# Patient Record
Sex: Male | Born: 1975 | Race: Black or African American | Hispanic: No | Marital: Single | Smoking: Current every day smoker
Health system: Southern US, Community
[De-identification: ages and names within clinical notes are randomized; demographics above are authoritative.]

## PROBLEM LIST (undated history)

## (undated) DIAGNOSIS — K219 Gastro-esophageal reflux disease without esophagitis: Secondary | ICD-10-CM

## (undated) DIAGNOSIS — I10 Essential (primary) hypertension: Secondary | ICD-10-CM

---

## 2000-08-07 ENCOUNTER — Encounter: Payer: Self-pay | Admitting: Emergency Medicine

## 2000-08-07 ENCOUNTER — Emergency Department (HOSPITAL_COMMUNITY): Admission: EM | Admit: 2000-08-07 | Discharge: 2000-08-07 | Payer: Self-pay | Admitting: Internal Medicine

## 2000-08-07 ENCOUNTER — Encounter: Payer: Self-pay | Admitting: Internal Medicine

## 2001-12-25 ENCOUNTER — Encounter: Payer: Self-pay | Admitting: Internal Medicine

## 2001-12-25 ENCOUNTER — Ambulatory Visit (HOSPITAL_COMMUNITY): Admission: RE | Admit: 2001-12-25 | Discharge: 2001-12-25 | Payer: Self-pay | Admitting: Internal Medicine

## 2004-03-11 ENCOUNTER — Emergency Department (HOSPITAL_COMMUNITY): Admission: EM | Admit: 2004-03-11 | Discharge: 2004-03-11 | Payer: Self-pay | Admitting: Emergency Medicine

## 2010-09-17 ENCOUNTER — Encounter: Payer: Self-pay | Admitting: *Deleted

## 2010-09-17 ENCOUNTER — Emergency Department (HOSPITAL_COMMUNITY): Payer: No Typology Code available for payment source

## 2010-09-17 ENCOUNTER — Emergency Department (HOSPITAL_COMMUNITY)
Admission: EM | Admit: 2010-09-17 | Discharge: 2010-09-17 | Disposition: A | Discharge status: Home / Self Care | Payer: No Typology Code available for payment source | Attending: Emergency Medicine | Admitting: Emergency Medicine

## 2010-09-17 DIAGNOSIS — T148XXA Other injury of unspecified body region, initial encounter: Secondary | ICD-10-CM | POA: Insufficient documentation

## 2010-09-17 DIAGNOSIS — F17200 Nicotine dependence, unspecified, uncomplicated: Secondary | ICD-10-CM | POA: Insufficient documentation

## 2010-09-17 DIAGNOSIS — M549 Dorsalgia, unspecified: Secondary | ICD-10-CM | POA: Insufficient documentation

## 2010-09-17 DIAGNOSIS — R109 Unspecified abdominal pain: Secondary | ICD-10-CM | POA: Insufficient documentation

## 2010-09-17 HISTORY — DX: Gastro-esophageal reflux disease without esophagitis: K21.9

## 2010-09-17 MED ORDER — HYDROCODONE-ACETAMINOPHEN 5-325 MG PO TABS: ORAL_TABLET | ORAL | Status: DC

## 2010-09-17 MED ORDER — METHOCARBAMOL 500 MG PO TABS: ORAL_TABLET | ORAL | Status: DC

## 2010-09-17 MED ORDER — IOHEXOL 300 MG/ML  SOLN
100.0000 mL | Freq: Once | INTRAMUSCULAR | Status: AC | PRN
  Administered 2010-09-17: 100 mL via INTRAVENOUS
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
## 2010-09-18 NOTE — ED Provider Notes (Signed)
Medical screening examination/treatment/procedure(s) were performed by non-physician practitioner and as supervising physician I was immediately available for consultation/collaboration.   Geoffery Lyons, MD 09/18/10 [phone]

## 2014-05-09 ENCOUNTER — Emergency Department (HOSPITAL_COMMUNITY)
Admission: EM | Admit: 2014-05-09 | Discharge: 2014-05-09 | Disposition: A | Discharge status: Home / Self Care | Payer: Self-pay | Attending: Emergency Medicine | Admitting: Emergency Medicine

## 2014-05-09 ENCOUNTER — Encounter (HOSPITAL_COMMUNITY): Payer: Self-pay | Admitting: *Deleted

## 2014-05-09 ENCOUNTER — Emergency Department (HOSPITAL_COMMUNITY): Payer: Self-pay

## 2014-05-09 DIAGNOSIS — Y998 Other external cause status: Secondary | ICD-10-CM | POA: Insufficient documentation

## 2014-05-09 DIAGNOSIS — S40812A Abrasion of left upper arm, initial encounter: Secondary | ICD-10-CM | POA: Insufficient documentation

## 2014-05-09 DIAGNOSIS — Z720 Tobacco use: Secondary | ICD-10-CM | POA: Insufficient documentation

## 2014-05-09 DIAGNOSIS — Y9389 Activity, other specified: Secondary | ICD-10-CM | POA: Insufficient documentation

## 2014-05-09 DIAGNOSIS — S161XXA Strain of muscle, fascia and tendon at neck level, initial encounter: Secondary | ICD-10-CM | POA: Insufficient documentation

## 2014-05-09 DIAGNOSIS — S50311A Abrasion of right elbow, initial encounter: Secondary | ICD-10-CM | POA: Insufficient documentation

## 2014-05-09 DIAGNOSIS — Z8719 Personal history of other diseases of the digestive system: Secondary | ICD-10-CM | POA: Insufficient documentation

## 2014-05-09 DIAGNOSIS — Y92410 Unspecified street and highway as the place of occurrence of the external cause: Secondary | ICD-10-CM | POA: Insufficient documentation

## 2014-05-09 DIAGNOSIS — S40212A Abrasion of left shoulder, initial encounter: Secondary | ICD-10-CM | POA: Insufficient documentation

## 2014-05-09 LAB — COMPREHENSIVE METABOLIC PANEL
ALK PHOS: 39 U/L (ref 39–117)
ALT: 39 U/L (ref 0–53)
ANION GAP: 8 (ref 5–15)
AST: 33 U/L (ref 0–37)
Albumin: 4.3 g/dL (ref 3.5–5.2)
BILIRUBIN TOTAL: 0.6 mg/dL (ref 0.3–1.2)
BUN: 9 mg/dL (ref 6–23)
CHLORIDE: 102 mmol/L (ref 96–112)
CO2: 27 mmol/L (ref 19–32)
Calcium: 9.4 mg/dL (ref 8.4–10.5)
Creatinine, Ser: 1.1 mg/dL (ref 0.50–1.35)
GFR calc non Af Amer: 82 mL/min — ABNORMAL LOW (ref >90)
GLUCOSE: 86 mg/dL (ref 70–99)
POTASSIUM: 4.2 mmol/L (ref 3.5–5.1)
Sodium: 137 mmol/L (ref 135–145)
Total Protein: 7.2 g/dL (ref 6.0–8.3)

## 2014-05-09 LAB — CBC WITH DIFFERENTIAL/PLATELET
BASOS PCT: 0 % (ref 0–1)
Basophils Absolute: 0 10*3/uL (ref 0.0–0.1)
Eosinophils Absolute: 0.1 10*3/uL (ref 0.0–0.7)
Eosinophils Relative: 1 % (ref 0–5)
HEMATOCRIT: 49.9 % (ref 39.0–52.0)
HEMOGLOBIN: 17 g/dL (ref 13.0–17.0)
LYMPHS ABS: 1.7 10*3/uL (ref 0.7–4.0)
LYMPHS PCT: 25 % (ref 12–46)
MCH: 28.1 pg (ref 26.0–34.0)
MCHC: 34.1 g/dL (ref 30.0–36.0)
MCV: 82.5 fL (ref 78.0–100.0)
MONO ABS: 0.6 10*3/uL (ref 0.1–1.0)
MONOS PCT: 9 % (ref 3–12)
NEUTROS ABS: 4.4 10*3/uL (ref 1.7–7.7)
Neutrophils Relative %: 65 % (ref 43–77)
Platelets: 211 10*3/uL (ref 150–400)
RBC: 6.05 MIL/uL — ABNORMAL HIGH (ref 4.22–5.81)
RDW: 14.7 % (ref 11.5–15.5)
WBC: 6.7 10*3/uL (ref 4.0–10.5)

## 2014-05-09 MED ORDER — TETANUS-DIPHTH-ACELL PERTUSSIS 5-2.5-18.5 LF-MCG/0.5 IM SUSP
0.5000 mL | Freq: Once | INTRAMUSCULAR | Status: AC
  Administered 2014-05-09: 0.5 mL via INTRAMUSCULAR
  Filled 2014-05-09: qty 0.5

## 2014-05-09 MED ORDER — HYDROCODONE-ACETAMINOPHEN 5-325 MG PO TABS: 1.0000 | ORAL_TABLET | ORAL | Status: DC | PRN
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
## 2014-05-09 NOTE — Discharge Instructions (Signed)
Abrasion °An abrasion is a cut or scrape of the skin. Abrasions do not extend through all layers of the skin and most heal within 10 days. It is important to care for your abrasion properly to prevent infection. °CAUSES  °Most abrasions are caused by falling on, or gliding across, the ground or other surface. When your skin rubs on something, the outer and inner layer of skin rubs off, causing an abrasion. °DIAGNOSIS  °Your caregiver will be able to diagnose an abrasion during a physical exam.  °TREATMENT  °Your treatment depends on how large and deep the abrasion is. Generally, your abrasion will be cleaned with water and a mild soap to remove any dirt or debris. An antibiotic ointment may be put over the abrasion to prevent an infection. A bandage (dressing) may be wrapped around the abrasion to keep it from getting dirty.  °You may need a tetanus shot if: °· You cannot remember when you had your last tetanus shot. °· You have never had a tetanus shot. °· The injury broke your skin. °If you get a tetanus shot, your arm may swell, get red, and feel warm to the touch. This is common and not a problem. If you need a tetanus shot and you choose not to have one, there is a rare chance of getting tetanus. Sickness from tetanus can be serious.  °HOME CARE INSTRUCTIONS  °· If a dressing was applied, change it at least once a day or as directed by your caregiver. If the bandage sticks, soak it off with warm water.   °· Wash the area with water and a mild soap to remove all the ointment 2 times a day. Rinse off the soap and pat the area dry with a clean towel.   °· Reapply any ointment as directed by your caregiver. This will help prevent infection and keep the bandage from sticking. Use gauze over the wound and under the dressing to help keep the bandage from sticking.   °· Change your dressing right away if it becomes wet or dirty.   °· Only take over-the-counter or prescription medicines for pain, discomfort, or fever as  directed by your caregiver.   °· Follow up with your caregiver within 24-48 hours for a wound check, or as directed. If you were not given a wound-check appointment, look closely at your abrasion for redness, swelling, or pus. These are signs of infection. °SEEK IMMEDIATE MEDICAL CARE IF:  °· You have increasing pain in the wound.   °· You have redness, swelling, or tenderness around the wound.   °· You have pus coming from the wound.   °· You have a fever or persistent symptoms for more than 2-3 days. °· You have a fever and your symptoms suddenly get worse. °· You have a bad smell coming from the wound or dressing.   °MAKE SURE YOU:  °· Understand these instructions. °· Will watch your condition. °· Will get help right away if you are not doing well or get worse. °Document Released: 11/07/2004 Document Revised: 01/15/2012 Document Reviewed: 01/01/2011 °Exit[employer]® Patient Information ©2015 [employer]. This information is not intended to replace advice given to you by your health care provider. Make sure you discuss any questions you have with your health care provider. ° °Cervical Sprain °A cervical sprain is an injury in the neck in which the strong, fibrous tissues (ligaments) that connect your neck bones stretch or tear. Cervical sprains can range from mild to severe. Severe cervical sprains can cause the neck vertebrae to be unstable.   This can lead to damage of the spinal cord and can result in serious nervous system problems. The amount of time it takes for a cervical sprain to get better depends on the cause and extent of the injury. Most cervical sprains heal in 1 to 3 weeks. °CAUSES  °Severe cervical sprains may be caused by:  °· Contact sport injuries (such as from football, rugby, wrestling, hockey, auto racing, gymnastics, diving, martial arts, or boxing).   °· Motor vehicle collisions.   °· Whiplash injuries. This is an injury from a sudden forward and backward whipping movement of the head and  neck.  °· Falls.   °Mild cervical sprains may be caused by:  °· Being in an awkward position, such as while cradling a telephone between your ear and shoulder.   °· Sitting in a chair that does not offer proper support.   °· Working at a poorly designed computer station.   °· Looking up or down for long periods of time.   °SYMPTOMS  °· Pain, soreness, stiffness, or a burning sensation in the front, back, or sides of the neck. This discomfort may develop immediately after the injury or slowly, 24 hours or more after the injury.   °· Pain or tenderness directly in the middle of the back of the neck.   °· Shoulder or upper back pain.   °· Limited ability to move the neck.   °· Headache.   °· Dizziness.   °· Weakness, numbness, or tingling in the hands or arms.   °· Muscle spasms.   °· Difficulty swallowing or chewing.   °· Tenderness and swelling of the neck.   °DIAGNOSIS  °Most of the time your health care provider can diagnose a cervical sprain by taking your history and doing a physical exam. Your health care provider will ask about previous neck injuries and any known neck problems, such as arthritis in the neck. X-rays may be taken to find out if there are any other problems, such as with the bones of the neck. Other tests, such as a CT scan or MRI, may also be needed.  °TREATMENT  °Treatment depends on the severity of the cervical sprain. Mild sprains can be treated with rest, keeping the neck in place (immobilization), and pain medicines. Severe cervical sprains are immediately immobilized. Further treatment is done to help with pain, muscle spasms, and other symptoms and may include: °· Medicines, such as pain relievers, numbing medicines, or muscle relaxants.   °· Physical therapy. This may involve stretching exercises, strengthening exercises, and posture training. Exercises and improved posture can help stabilize the neck, strengthen muscles, and help stop symptoms from returning.   °HOME CARE INSTRUCTIONS   °· Put ice on the injured area.   °¨ Put ice in a plastic bag.   °¨ Place a towel between your skin and the bag.   °¨ Leave the ice on for 15-20 minutes, 3-4 times a day.   °· If your injury was severe, you may have been given a cervical collar to wear. A cervical collar is a two-piece collar designed to keep your neck from moving while it heals. °¨ Do not remove the collar unless instructed by your health care provider. °¨ If you have long hair, keep it outside of the collar. °¨ Ask your health care provider before making any adjustments to your collar. Minor adjustments may be required over time to improve comfort and reduce pressure on your chin or on the back of your head. °¨ If you are allowed to remove the collar for cleaning or bathing, follow your health care provider's instructions on how to do   so safely. °¨ Keep your collar clean by wiping it with mild soap and water and drying it completely. If the collar you have been given includes removable pads, remove them every 1-2 days and hand wash them with soap and water. Allow them to air dry. They should be completely dry before you wear them in the collar. °¨ If you are allowed to remove the collar for cleaning and bathing, wash and dry the skin of your neck. Check your skin for irritation or sores. If you see any, tell your health care provider. °¨ Do not drive while wearing the collar.   °· Only take over-the-counter or prescription medicines for pain, discomfort, or fever as directed by your health care provider.   °· Keep all follow-up appointments as directed by your health care provider.   °· Keep all physical therapy appointments as directed by your health care provider.   °· Make any needed adjustments to your workstation to promote good posture.   °· Avoid positions and activities that make your symptoms worse.   °· Warm up and stretch before being active to help prevent problems.   °SEEK MEDICAL CARE IF:  °· Your pain is not controlled with  medicine.   °· You are unable to decrease your pain medicine over time as planned.   °· Your activity level is not improving as expected.   °SEEK IMMEDIATE MEDICAL CARE IF:  °· You develop any bleeding. °· You develop stomach upset. °· You have signs of an allergic reaction to your medicine.   °· Your symptoms get worse.   °· You develop new, unexplained symptoms.   °· You have numbness, tingling, weakness, or paralysis in any part of your body.   °MAKE SURE YOU:  °· Understand these instructions. °· Will watch your condition. °· Will get help right away if you are not doing well or get worse. °Document Released: 11/25/2006 Document Revised: 02/02/2013 Document Reviewed: 08/05/2012 °ExitCare® Patient Information ©2015 [employer]. This information is not intended to replace advice given to you by your health care provider. Make sure you discuss any questions you have with your health care provider. ° °Motor Vehicle Collision °It is common to have multiple bruises and sore muscles after a motor vehicle collision (MVC). These tend to feel worse for the first 24 hours. You may have the most stiffness and soreness over the first several hours. You may also feel worse when you wake up the first morning after your collision. After this point, you will usually begin to improve with each day. The speed of improvement often depends on the severity of the collision, the number of injuries, and the location and nature of these injuries. °HOME CARE INSTRUCTIONS °· Put ice on the injured area. °¨ Put ice in a plastic bag. °¨ Place a towel between your skin and the bag. °¨ Leave the ice on for 15-20 minutes, 3-4 times a day, or as directed by your health care provider. °· Drink enough fluids to keep your urine clear or pale yellow. Do not drink alcohol. °· Take a warm shower or bath once or twice a day. This will increase blood flow to sore muscles. °· You may return to activities as directed by your caregiver. Be careful when  lifting, as this may aggravate neck or back pain. °· Only take over-the-counter or prescription medicines for pain, discomfort, or fever as directed by your caregiver. Do not use aspirin. This may increase bruising and bleeding. °SEEK IMMEDIATE MEDICAL CARE IF: °· You have numbness, tingling, or weakness in the arms or   legs. °· You develop severe headaches not relieved with medicine. °· You have severe neck pain, especially tenderness in the middle of the back of your neck. °· You have changes in bowel or bladder control. °· There is increasing pain in any area of the body. °· You have shortness of breath, light-headedness, dizziness, or fainting. °· You have chest pain. °· You feel sick to your stomach (nauseous), throw up (vomit), or sweat. °· You have increasing abdominal discomfort. °· There is blood in your urine, stool, or vomit. °· You have pain in your shoulder (shoulder strap areas). °· You feel your symptoms are getting worse. °MAKE SURE YOU: °· Understand these instructions. °· Will watch your condition. °· Will get help right away if you are not doing well or get worse. °Document Released: 01/28/2005 Document Revised: 06/14/2013 Document Reviewed: 06/27/2010 °ExitCare® Patient Information ©2015 [employer]. This information is not intended to replace advice given to you by your health care provider. Make sure you discuss any questions you have with your health care provider. ° °
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
## 2014-05-09 NOTE — ED Notes (Signed)
MD at bedside. 

## 2014-05-09 NOTE — ED Notes (Signed)
c-collar applied in triage

## 2019-07-14 ENCOUNTER — Emergency Department (HOSPITAL_COMMUNITY)
Admission: EM | Admit: 2019-07-14 | Discharge: 2019-07-14 | Disposition: A | Discharge status: Home / Self Care | Payer: BC Managed Care – PPO | Attending: Emergency Medicine | Admitting: Emergency Medicine

## 2019-07-14 ENCOUNTER — Encounter (HOSPITAL_COMMUNITY): Payer: Self-pay | Admitting: *Deleted

## 2019-07-14 ENCOUNTER — Other Ambulatory Visit: Payer: Self-pay

## 2019-07-14 ENCOUNTER — Emergency Department (HOSPITAL_COMMUNITY): Payer: BC Managed Care – PPO

## 2019-07-14 DIAGNOSIS — I10 Essential (primary) hypertension: Secondary | ICD-10-CM | POA: Insufficient documentation

## 2019-07-14 DIAGNOSIS — R519 Headache, unspecified: Secondary | ICD-10-CM

## 2019-07-14 DIAGNOSIS — F17210 Nicotine dependence, cigarettes, uncomplicated: Secondary | ICD-10-CM | POA: Diagnosis not present

## 2019-07-14 DIAGNOSIS — R42 Dizziness and giddiness: Secondary | ICD-10-CM | POA: Insufficient documentation

## 2019-07-14 LAB — CBC WITH DIFFERENTIAL/PLATELET
Abs Immature Granulocytes: 0.01 10*3/uL (ref 0.00–0.07)
Basophils Absolute: 0 10*3/uL (ref 0.0–0.1)
Basophils Relative: 1 %
Eosinophils Absolute: 0.1 10*3/uL (ref 0.0–0.5)
Eosinophils Relative: 1 %
HCT: 52.2 % — ABNORMAL HIGH (ref 39.0–52.0)
Hemoglobin: 16.9 g/dL (ref 13.0–17.0)
Immature Granulocytes: 0 %
Lymphocytes Relative: 37 %
Lymphs Abs: 1.5 10*3/uL (ref 0.7–4.0)
MCH: 27.5 pg (ref 26.0–34.0)
MCHC: 32.4 g/dL (ref 30.0–36.0)
MCV: 85 fL (ref 80.0–100.0)
Monocytes Absolute: 0.5 10*3/uL (ref 0.1–1.0)
Monocytes Relative: 13 %
Neutro Abs: 2 10*3/uL (ref 1.7–7.7)
Neutrophils Relative %: 48 %
Platelets: 212 10*3/uL (ref 150–400)
RBC: 6.14 MIL/uL — ABNORMAL HIGH (ref 4.22–5.81)
RDW: 14.5 % (ref 11.5–15.5)
WBC: 4.1 10*3/uL (ref 4.0–10.5)
nRBC: 0 % (ref 0.0–0.2)

## 2019-07-14 LAB — COMPREHENSIVE METABOLIC PANEL
ALT: 39 U/L (ref 0–44)
AST: 32 U/L (ref 15–41)
Albumin: 5 g/dL (ref 3.5–5.0)
Alkaline Phosphatase: 33 U/L — ABNORMAL LOW (ref 38–126)
Anion gap: 10 (ref 5–15)
BUN: 13 mg/dL (ref 6–20)
CO2: 25 mmol/L (ref 22–32)
Calcium: 9.1 mg/dL (ref 8.9–10.3)
Chloride: 99 mmol/L (ref 98–111)
Creatinine, Ser: 0.91 mg/dL (ref 0.61–1.24)
GFR calc Af Amer: >60 mL/min (ref >60)
GFR calc non Af Amer: >60 mL/min (ref >60)
Glucose, Bld: 117 mg/dL — ABNORMAL HIGH (ref 70–99)
Potassium: 4.2 mmol/L (ref 3.5–5.1)
Sodium: 134 mmol/L — ABNORMAL LOW (ref 135–145)
Total Bilirubin: 0.8 mg/dL (ref 0.3–1.2)
Total Protein: 7.8 g/dL (ref 6.5–8.1)

## 2019-07-14 MED ORDER — HYDRALAZINE HCL 20 MG/ML IJ SOLN
5.0000 mg | Freq: Once | INTRAMUSCULAR | Status: AC
  Administered 2019-07-14: 5 mg via INTRAVENOUS
  Filled 2019-07-14: qty 1

## 2019-07-14 MED ORDER — MORPHINE SULFATE (PF) 2 MG/ML IV SOLN
2.0000 mg | Freq: Once | INTRAVENOUS | Status: AC
  Administered 2019-07-14: 2 mg via INTRAVENOUS
  Filled 2019-07-14: qty 1

## 2019-07-14 MED ORDER — HYDROCHLOROTHIAZIDE 25 MG PO TABS: 25.0000 mg | ORAL_TABLET | Freq: Every day | ORAL | 0 refills | Status: DC

## 2019-07-14 MED ORDER — HYDROMORPHONE HCL 1 MG/ML IJ SOLN
1.0000 mg | Freq: Once | INTRAMUSCULAR | Status: AC
  Administered 2019-07-14: 1 mg via INTRAVENOUS
  Filled 2019-07-14: qty 1

## 2019-07-14 NOTE — ED Triage Notes (Addendum)
Pt c/o headache behind his eyes x 3 days. Pt c/o "little bit" dizziness and fatigue that started today.   Pt's BP in triage is 175/135. Pt reports his doctor wanted to put him on BP medication years ago, but didn't because of "his age". Pt denies having a PCP at this time.

## 2019-07-14 NOTE — ED Provider Notes (Signed)
[facility] EMERGENCY DEPARTMENT Provider Note   CSN: [id number] Arrival date & time: 07/14/19  1111     History Chief Complaint  Patient presents with  . Headache    Anthony Shea is a 44 y.o. male.  Patient complains of a headache and dizziness.  He has a history of hypertension and has not been taking his medicine  The history is provided by the patient and medical records.  Headache Pain location:  Generalized Quality:  Dull Radiates to:  Does not radiate Severity currently:  7/10 Severity at highest:  8/10 Onset quality:  Sudden Timing:  Constant Progression:  Worsening Chronicity:  New Similar to prior headaches: no   Context: activity   Relieved by:  Nothing Worsened by:  Nothing Associated symptoms: no abdominal pain, no back pain, no congestion, no cough, no diarrhea, no fatigue, no seizures and no sinus pressure        Past Medical History:  Diagnosis Date  . GERD (gastroesophageal reflux disease)     There are no problems to display for this patient.   History reviewed. No pertinent surgical history.     No family history on file.  Social History   Tobacco Use  . Smoking status: Current Every Day Smoker    Packs/day: 0.50    Types: Cigarettes  . Smokeless tobacco: Never Used  Substance Use Topics  . Alcohol use: Yes    Comment: couple shots of liquor every couple of weeks   . Drug use: No    Home Medications Prior to Admission medications   Medication Sig Start Date End Date Taking? Authorizing Provider  ibuprofen (ADVIL) 200 MG tablet Take 200 mg by mouth every 6 (six) hours as needed for mild pain or moderate pain.   Yes [provider]    Allergies    Banana  Review of Systems   Review of Systems  Constitutional: Negative for appetite change and fatigue.  HENT: Negative for congestion, ear discharge and sinus pressure.   Eyes: Negative for discharge.  Respiratory: Negative for cough.   Cardiovascular: Negative for  chest pain.  Gastrointestinal: Negative for abdominal pain and diarrhea.  Genitourinary: Negative for frequency and hematuria.  Musculoskeletal: Negative for back pain.  Skin: Negative for rash.  Neurological: Positive for headaches. Negative for seizures.  Psychiatric/Behavioral: Negative for hallucinations.    Physical Exam Updated Vital Signs BP (!) 160/101 (BP Location: Left Arm)   Pulse 92   Temp 99.2 F (37.3 C) (Oral)   Resp 19   Ht 5\' 4"  (1.626 m)   Wt 74.8 kg   SpO2 97%   BMI 28.32 kg/m   Physical Exam Vitals and nursing note reviewed.  Constitutional:      Appearance: He is well-developed.  HENT:     Head: Normocephalic.     Nose: Nose normal.  Eyes:     General: No scleral icterus.    Conjunctiva/sclera: Conjunctivae normal.  Neck:     Thyroid: No thyromegaly.  Cardiovascular:     Rate and Rhythm: Normal rate and regular rhythm.     Heart sounds: No murmur. No friction rub. No gallop.   Pulmonary:     Breath sounds: No stridor. No wheezing or rales.  Chest:     Chest wall: No tenderness.  Abdominal:     General: There is no distension.     Tenderness: There is no abdominal tenderness. There is no rebound.  Musculoskeletal:  General: Normal range of motion.     Cervical back: Neck supple.  Lymphadenopathy:     Cervical: No cervical adenopathy.  Skin:    Findings: No erythema or rash.  Neurological:     Mental Status: He is alert and oriented to person, place, and time.     Motor: No abnormal muscle tone.     Coordination: Coordination normal.  Psychiatric:        Behavior: Behavior normal.     ED Results / Procedures / Treatments   Labs (all labs ordered are listed, but only abnormal results are displayed) Labs Reviewed  CBC WITH DIFFERENTIAL/PLATELET - Abnormal; Notable for the following components:      Result Value   RBC 6.14 (*)    HCT 52.2 (*)    All other components within normal limits  COMPREHENSIVE METABOLIC PANEL -  Abnormal; Notable for the following components:   Sodium 134 (*)    Glucose, Bld 117 (*)    Alkaline Phosphatase 33 (*)    All other components within normal limits    EKG None  Radiology CT Head Wo Contrast  Result Date: 07/14/2019 CLINICAL DATA:  Retro-orbital headache over the last 3 days. Dizziness. EXAM: CT HEAD WITHOUT CONTRAST TECHNIQUE: Contiguous axial images were obtained from the base of the skull through the vertex without intravenous contrast. COMPARISON:  None FINDINGS: Brain: The brain shows a normal appearance without evidence of malformation, atrophy, old or acute small or large vessel infarction, mass lesion, hemorrhage, hydrocephalus or extra-axial collection. Vascular: No hyperdense vessel. No evidence of atherosclerotic calcification. Skull: Normal.  No traumatic finding.  No focal bone lesion. Sinuses/Orbits: Sinuses are clear. Orbits appear normal. Mastoids are clear. Other: None significant IMPRESSION: Normal head CT Electronically Signed   By: Nelson Chimes M.D.   On: 07/14/2019 14:05    Procedures Procedures (including critical care time)  Medications Ordered in ED Medications  hydrALAZINE (APRESOLINE) injection 5 mg (has no administration in time range)  HYDROmorphone (DILAUDID) injection 1 mg (has no administration in time range)  hydrALAZINE (APRESOLINE) injection 5 mg (5 mg Intravenous Given 07/14/19 1247)  morphine 2 MG/ML injection 2 mg (2 mg Intravenous Given 07/14/19 1417)    ED Course  I have reviewed the triage vital signs and the nursing notes.  Pertinent labs & imaging results that were available during my care of the patient were reviewed by me and considered in my medical decision making (see chart for details). CRITICAL CARE Performed by: Milton Ferguson Total critical care time: 75minutes Critical care time was exclusive of separately billable procedures and treating other patients. Critical care was necessary to treat or prevent imminent or  life-threatening deterioration. Critical care was time spent personally by me on the following activities: development of treatment plan with patient and/or surrogate as well as nursing, discussions with consultants, evaluation of patient's response to treatment, examination of patient, obtaining history from patient or surrogate, ordering and performing treatments and interventions, ordering and review of laboratory studies, ordering and review of radiographic studies, pulse oximetry and re-evaluation of patient's condition.    MDM Rules/Calculators/A&P                      Patient with headache and poorly controlled blood pressure.  He will be discharged home with blood pressure medicine        This patient presents to the ED for concern of headache this involves an extensive number of treatment options, and is  a complaint that carries with it a high risk of complications and morbidity.  The differential diagnosis includes htn,  Head bleed   Lab Tests:   I Ordered, reviewed, and interpreted labs, which included cbc, chem,  Results unremarkable  Medicines ordered:  I ordered medication hydralazine for bp Imaging Studies ordered:   I ordered imaging studies which included ct head and  I independently visualized and interpreted imaging which showed neg  Additional history obtained:   Additional history obtained from records  Previous records obtained and reviewed  Consultations Obtained:     Reevaluation:  After the interventions stated above, I reevaluated the patient and found improved  Critical Interventions:  .   Final Clinical Impression(s) / ED Diagnoses Final diagnoses:  None    Rx / DC Orders ED Discharge Orders    None       Bethann Berkshire, MD 07/19/19 1253

## 2019-07-14 NOTE — ED Provider Notes (Signed)
Care of the patient was assumed at the change of shift.  Physical Exam  BP (!) 142/105   Pulse 83   Temp 99.2 F (37.3 C) (Oral)   Resp 19   Ht 5\' 4"  (1.626 m)   Wt 74.8 kg   SpO2 95%   BMI 28.32 kg/m   Physical Exam Awake and alert, talking on the phone. No obvious focal neuro deficits.  ED Course/Procedures     Procedures  MDM  Headache improved, BP improved. Patient is ready go to home. Will d/c with HCTZ for BP control, close outpatient PCP followup for recheck. RTED for any other concerns.        , MD 07/14/19 1626

## 2019-07-17 ENCOUNTER — Encounter (HOSPITAL_COMMUNITY): Payer: Self-pay

## 2019-07-17 ENCOUNTER — Emergency Department (HOSPITAL_COMMUNITY)
Admission: EM | Admit: 2019-07-17 | Discharge: 2019-07-17 | Disposition: A | Discharge status: Home / Self Care | Payer: BC Managed Care – PPO | Attending: Emergency Medicine | Admitting: Emergency Medicine

## 2019-07-17 ENCOUNTER — Other Ambulatory Visit: Payer: Self-pay

## 2019-07-17 DIAGNOSIS — I10 Essential (primary) hypertension: Secondary | ICD-10-CM | POA: Diagnosis not present

## 2019-07-17 DIAGNOSIS — F17210 Nicotine dependence, cigarettes, uncomplicated: Secondary | ICD-10-CM | POA: Diagnosis not present

## 2019-07-17 DIAGNOSIS — K0889 Other specified disorders of teeth and supporting structures: Secondary | ICD-10-CM | POA: Diagnosis not present

## 2019-07-17 DIAGNOSIS — G44209 Tension-type headache, unspecified, not intractable: Secondary | ICD-10-CM | POA: Diagnosis not present

## 2019-07-17 DIAGNOSIS — R519 Headache, unspecified: Secondary | ICD-10-CM | POA: Diagnosis present

## 2019-07-17 HISTORY — DX: Essential (primary) hypertension: I10

## 2019-07-17 MED ORDER — PROCHLORPERAZINE MALEATE 10 MG PO TABS: 10.0000 mg | ORAL_TABLET | Freq: Two times a day (BID) | ORAL | 0 refills | Status: AC | PRN

## 2019-07-17 MED ORDER — HYDROCHLOROTHIAZIDE 25 MG PO TABS
25.0000 mg | ORAL_TABLET | Freq: Every day | ORAL | Status: DC
  Administered 2019-07-17: 25 mg via ORAL
  Filled 2019-07-17 (×2): qty 1

## 2019-07-17 MED ORDER — KETOROLAC TROMETHAMINE 30 MG/ML IJ SOLN
30.0000 mg | Freq: Once | INTRAMUSCULAR | Status: AC
  Administered 2019-07-17: 30 mg via INTRAVENOUS
  Filled 2019-07-17: qty 1

## 2019-07-17 MED ORDER — PENICILLIN V POTASSIUM 500 MG PO TABS: 500.0000 mg | ORAL_TABLET | Freq: Four times a day (QID) | ORAL | 0 refills | Status: AC

## 2019-07-17 MED ORDER — IBUPROFEN 800 MG PO TABS: 800.0000 mg | ORAL_TABLET | Freq: Three times a day (TID) | ORAL | 0 refills | Status: AC | PRN

## 2019-07-17 NOTE — Discharge Instructions (Signed)

## 2019-07-17 NOTE — ED Triage Notes (Signed)
Pt reports headache for the past week.  Reports initially had some generalized weakness and dizziness but not at this time.

## 2019-07-17 NOTE — ED Provider Notes (Signed)
Emergency Department Provider Note   I have reviewed the triage vital signs and the nursing notes.   HISTORY  Chief Complaint Headache   HPI Chevy Sweigert Keirsey is a 44 y.o. male with PMH of GERD and HTN presents to the ED with continued HA.  Patient has been compliant with his HCTZ medication prescribed during his recent ED visit on 6/2.  He did not take his dose this morning, however.  He has had a headache over the past several days.  Describes it as migratory but currently is on the left forehead area.  He is not experiencing fevers.  No weakness or numbness.  No vision changes.  No head injuries or eye injuries.  No speech disturbance.  Denies any confusion or foggy feeling.  He has had headaches occasionally like this in the past but this has been more persistent. No radiation of symptoms or modifying factors.   Past Medical History:  Diagnosis Date   GERD (gastroesophageal reflux disease)    Hypertension     There are no problems to display for this patient.   History reviewed. No pertinent surgical history.  Allergies Banana  No family history on file.  Social History Social History   Tobacco Use   Smoking status: Current Every Day Smoker    Packs/day: 0.50    Types: Cigarettes   Smokeless tobacco: Never Used  Substance Use Topics   Alcohol use: Yes    Comment: couple shots of liquor every couple of weeks    Drug use: No    Review of Systems  Constitutional: No fever/chills Eyes: No visual changes. ENT: No sore throat. Cardiovascular: Denies chest pain. Elevated BP.  Respiratory: Denies shortness of breath. Gastrointestinal: No abdominal pain.  No nausea, no vomiting.  No diarrhea.  No constipation. Genitourinary: Negative for dysuria. Musculoskeletal: Negative for back pain. Skin: Negative for rash. Neurological: Negative for focal weakness or numbness. Positive HA.   10-point ROS otherwise  negative.  ____________________________________________   PHYSICAL EXAM:  VITAL SIGNS: ED Triage Vitals  Enc Vitals Group     BP 07/17/19 0915 (!) 146/123     Pulse Rate 07/17/19 0915 86     Resp 07/17/19 0915 18     Temp 07/17/19 0915 98.8 F (37.1 C)     Temp Source 07/17/19 0915 Oral     SpO2 07/17/19 0915 98 %     Weight 07/17/19 0913 163 lb 2.3 oz (74 kg)     Height 07/17/19 0913 5\' 4"  (1.626 m)   Constitutional: Alert and oriented. Well appearing and in no acute distress. Eyes: Conjunctivae are normal. PERRL. EOMI. Head: Atraumatic. Nose: No congestion/rhinnorhea. Mouth/Throat: Mucous membranes are moist.  Neck: No stridor.   Cardiovascular: Normal rate, regular rhythm. Good peripheral circulation. Grossly normal heart sounds.   Respiratory: Normal respiratory effort.  No retractions. Lungs CTAB. Gastrointestinal: Soft and nontender. No distention.  Musculoskeletal:  No gross deformities of extremities. Neurologic:  Normal speech and language. No gross focal neurologic deficits are appreciated.  No facial asymmetry.  Normal finger-to-nose testing bilaterally.  No pronator drift. 5/5 strength and sensation in the bilateral upper and lower extremities.  Skin:  Skin is warm, dry and intact. No rash noted.  ____________________________________________  RADIOLOGY  None  ____________________________________________   PROCEDURES  Procedure(s) performed:   Procedures  None  ____________________________________________   INITIAL IMPRESSION / ASSESSMENT AND PLAN / ED COURSE  Pertinent labs & imaging results that were available during my care of  the patient were reviewed by me and considered in my medical decision making (see chart for details).   Patient presents to the emergency department with continued headache.  He has no neurologic deficits.  Grossly normal visual exam.  Eye is not red or tender to touch. No findings to suspect ocular pathology contributing to  HA. No evidence of infection.  No findings on exam or historical features to suspect infectious etiology of headache or vascular pathology such as subarachnoid hemorrhage.  CT head and lab work reviewed from the patient's last ED visit.  Blood pressure here shows elevated diastolic but similar to prior values to slightly improved numbers.  Plan for symptom management here and close PCP follow-up.   10:31 AM  Slight improvement with Toradol here.  On reassessment the patient tells me that he has had some dental pain which he attributed to wisdom teeth 2 weeks ago.  The pain is improved but he is wondering if this could be contributing to his headache.  I do not see any visible dental abscess.  Will cover with penicillin given possibility for some periapical disease.  Patient to call his dentist back and schedule the next available appointment which he was told recently would not be until July.  He will also call to discuss a cancellation list.  Will provide contact information for primary care doctor and central phone number which can assist with establishing care with PCP who is accepting new patients.  He will continue his blood pressure medication.  No indication on exam or history to suspect hypertensive emergency at this time.  ____________________________________________  FINAL CLINICAL IMPRESSION(S) / ED DIAGNOSES  Final diagnoses:  Acute non intractable tension-type headache  Pain, dental     MEDICATIONS GIVEN DURING THIS VISIT:  Medications  hydrochlorothiazide (HYDRODIURIL) tablet 25 mg (25 mg Oral Given 07/17/19 0929)  ketorolac (TORADOL) 30 MG/ML injection 30 mg (30 mg Intravenous Given 07/17/19 0933)     NEW OUTPATIENT MEDICATIONS STARTED DURING THIS VISIT:  New Prescriptions   IBUPROFEN (ADVIL) 800 MG TABLET    Take 1 tablet (800 mg total) by mouth every 8 (eight) hours as needed for headache or moderate pain.   PENICILLIN V POTASSIUM (VEETID) 500 MG TABLET    Take 1 tablet (500 mg  total) by mouth 4 (four) times daily for 7 days.   PROCHLORPERAZINE (COMPAZINE) 10 MG TABLET    Take 1 tablet (10 mg total) by mouth 2 (two) times daily as needed for nausea or vomiting (headache).    Note:  This document was prepared using Dragon voice recognition software and may include unintentional dictation errors.  Nanda Quinton, MD, [facility]    Yuliet Needs, Wonda Olds, MD 07/17/19 1032

## 2019-08-27 ENCOUNTER — Telehealth: Payer: Self-pay

## 2019-08-27 NOTE — Telephone Encounter (Signed)

## 2019-08-30 ENCOUNTER — Ambulatory Visit (INDEPENDENT_AMBULATORY_CARE_PROVIDER_SITE_OTHER): Payer: BC Managed Care – PPO | Admitting: Internal Medicine

## 2019-09-15 ENCOUNTER — Telehealth: Payer: BC Managed Care – PPO | Admitting: Internal Medicine

## 2019-09-15 DIAGNOSIS — Z125 Encounter for screening for malignant neoplasm of prostate: Secondary | ICD-10-CM

## 2019-09-15 DIAGNOSIS — I10 Essential (primary) hypertension: Secondary | ICD-10-CM

## 2019-09-15 DIAGNOSIS — Z13220 Encounter for screening for lipoid disorders: Secondary | ICD-10-CM

## 2019-09-15 DIAGNOSIS — Z114 Encounter for screening for human immunodeficiency virus [HIV]: Secondary | ICD-10-CM

## 2019-09-15 DIAGNOSIS — Z1159 Encounter for screening for other viral diseases: Secondary | ICD-10-CM

## 2019-09-15 DIAGNOSIS — Z7689 Persons encountering health services in other specified circumstances: Secondary | ICD-10-CM

## 2020-12-28 ENCOUNTER — Encounter (HOSPITAL_COMMUNITY): Payer: Self-pay | Admitting: *Deleted

## 2020-12-28 ENCOUNTER — Emergency Department (HOSPITAL_COMMUNITY)
Admission: EM | Admit: 2020-12-28 | Discharge: 2020-12-28 | Disposition: A | Discharge status: Home / Self Care | Payer: BC Managed Care – PPO | Attending: Emergency Medicine | Admitting: Emergency Medicine

## 2020-12-28 DIAGNOSIS — R059 Cough, unspecified: Secondary | ICD-10-CM | POA: Insufficient documentation

## 2020-12-28 DIAGNOSIS — F17210 Nicotine dependence, cigarettes, uncomplicated: Secondary | ICD-10-CM | POA: Insufficient documentation

## 2020-12-28 DIAGNOSIS — Z20822 Contact with and (suspected) exposure to covid-19: Secondary | ICD-10-CM | POA: Diagnosis not present

## 2020-12-28 DIAGNOSIS — M791 Myalgia, unspecified site: Secondary | ICD-10-CM | POA: Diagnosis not present

## 2020-12-28 DIAGNOSIS — R110 Nausea: Secondary | ICD-10-CM | POA: Insufficient documentation

## 2020-12-28 DIAGNOSIS — Z79899 Other long term (current) drug therapy: Secondary | ICD-10-CM | POA: Insufficient documentation

## 2020-12-28 DIAGNOSIS — I10 Essential (primary) hypertension: Secondary | ICD-10-CM | POA: Insufficient documentation

## 2020-12-28 DIAGNOSIS — R519 Headache, unspecified: Secondary | ICD-10-CM | POA: Insufficient documentation

## 2020-12-28 DIAGNOSIS — R509 Fever, unspecified: Secondary | ICD-10-CM | POA: Diagnosis not present

## 2020-12-28 DIAGNOSIS — R6889 Other general symptoms and signs: Secondary | ICD-10-CM

## 2020-12-28 LAB — CBC WITH DIFFERENTIAL/PLATELET
Abs Immature Granulocytes: 0.03 10*3/uL (ref 0.00–0.07)
Basophils Absolute: 0.1 10*3/uL (ref 0.0–0.1)
Basophils Relative: 1 %
Eosinophils Absolute: 0 10*3/uL (ref 0.0–0.5)
Eosinophils Relative: 0 %
HCT: 48.1 % (ref 39.0–52.0)
Hemoglobin: 16.4 g/dL (ref 13.0–17.0)
Immature Granulocytes: 1 %
Lymphocytes Relative: 20 %
Lymphs Abs: 1.3 10*3/uL (ref 0.7–4.0)
MCH: 28.1 pg (ref 26.0–34.0)
MCHC: 34.1 g/dL (ref 30.0–36.0)
MCV: 82.4 fL (ref 80.0–100.0)
Monocytes Absolute: 1.3 10*3/uL — ABNORMAL HIGH (ref 0.1–1.0)
Monocytes Relative: 21 %
Neutro Abs: 3.7 10*3/uL (ref 1.7–7.7)
Neutrophils Relative %: 57 %
Platelets: 211 10*3/uL (ref 150–400)
RBC: 5.84 MIL/uL — ABNORMAL HIGH (ref 4.22–5.81)
RDW: 13.6 % (ref 11.5–15.5)
WBC: 6.3 10*3/uL (ref 4.0–10.5)
nRBC: 0 % (ref 0.0–0.2)

## 2020-12-28 LAB — COMPREHENSIVE METABOLIC PANEL
ALT: 52 U/L — ABNORMAL HIGH (ref 0–44)
AST: 54 U/L — ABNORMAL HIGH (ref 15–41)
Albumin: 3.5 g/dL (ref 3.5–5.0)
Alkaline Phosphatase: 49 U/L (ref 38–126)
Anion gap: 11 (ref 5–15)
BUN: 18 mg/dL (ref 6–20)
CO2: 22 mmol/L (ref 22–32)
Calcium: 8.5 mg/dL — ABNORMAL LOW (ref 8.9–10.3)
Chloride: 95 mmol/L — ABNORMAL LOW (ref 98–111)
Creatinine, Ser: 0.99 mg/dL (ref 0.61–1.24)
GFR, Estimated: >60 mL/min (ref >60)
Glucose, Bld: 106 mg/dL — ABNORMAL HIGH (ref 70–99)
Potassium: 3.9 mmol/L (ref 3.5–5.1)
Sodium: 128 mmol/L — ABNORMAL LOW (ref 135–145)
Total Bilirubin: 1.5 mg/dL — ABNORMAL HIGH (ref 0.3–1.2)
Total Protein: 7.9 g/dL (ref 6.5–8.1)

## 2020-12-28 LAB — RESP PANEL BY RT-PCR (FLU A&B, COVID) ARPGX2
Influenza A by PCR: NEGATIVE
Influenza B by PCR: NEGATIVE
SARS Coronavirus 2 by RT PCR: NEGATIVE

## 2020-12-28 LAB — URINALYSIS, ROUTINE W REFLEX MICROSCOPIC
Bilirubin Urine: NEGATIVE
Glucose, UA: NEGATIVE mg/dL
Ketones, ur: 5 mg/dL — AB
Leukocytes,Ua: NEGATIVE
Nitrite: NEGATIVE
Protein, ur: 100 mg/dL — AB
Specific Gravity, Urine: 1.028 (ref 1.005–1.030)
pH: 5 (ref 5.0–8.0)

## 2020-12-28 MED ORDER — SODIUM CHLORIDE 0.9 % IV BOLUS
1000.0000 mL | Freq: Once | INTRAVENOUS | Status: AC
  Administered 2020-12-28: 16:00:00 1000 mL via INTRAVENOUS

## 2020-12-28 MED ORDER — ONDANSETRON HCL 4 MG/2ML IJ SOLN
4.0000 mg | Freq: Once | INTRAMUSCULAR | Status: AC
  Administered 2020-12-28: 14:00:00 4 mg via INTRAVENOUS
  Filled 2020-12-28: qty 2

## 2020-12-28 MED ORDER — ONDANSETRON 4 MG PO TBDP: 4.0000 mg | ORAL_TABLET | Freq: Three times a day (TID) | ORAL | 0 refills | Status: AC | PRN

## 2020-12-28 MED ORDER — SODIUM CHLORIDE 0.9 % IV BOLUS
1000.0000 mL | Freq: Once | INTRAVENOUS | Status: AC
  Administered 2020-12-28: 14:00:00 1000 mL via INTRAVENOUS

## 2020-12-28 MED ORDER — ACETAMINOPHEN 325 MG PO TABS
650.0000 mg | ORAL_TABLET | Freq: Once | ORAL | Status: AC
  Administered 2020-12-28: 14:00:00 650 mg via ORAL
  Filled 2020-12-28: qty 2

## 2020-12-28 NOTE — ED Notes (Signed)
Pt provided water and saltine crackers for PO challenge

## 2020-12-28 NOTE — ED Triage Notes (Signed)
Headache x 1 week

## 2020-12-28 NOTE — Discharge Instructions (Addendum)
Rest make sure you are drinking plenty of fluids.  You may take Tylenol or Motrin if needed for return of your fever, as discussed you can use the opposite medication every 3 hours if needed for better fever control.  You have been prescribed a nausea medicine for use if the symptom returns.  Plan a recheck if your symptoms are not resolving over the next several days.

## 2020-12-28 NOTE — ED Provider Notes (Signed)
[facility] EMERGENCY DEPARTMENT Provider Note   CSN: [id number] Arrival date & time: 12/28/20  1130     History Chief Complaint  Patient presents with   Headache    Anthony Shea is a 45 y.o. male presenting with an 8 day history of intermittent generalized headache, myalgia, subjective fever and non productive cough and nausea without emesis, denies abdominal pain, dysuria, back or neck pain or stiffness, no dysuria but states his urine has been darker than normal and he is only urinated once today.  Denies diarrhea.  He has been able to tolerate p.o. intake but has had reduced intake.  He has had no dizziness or lightheadedness but does have generalized fatigue.  He was seen at an urgent care center 3 days ago and reports he had a negative COVID and flu test.  He has had no known exposures to others with similar symptoms but does have a crowded work environment.  The history is provided by the patient.      Past Medical History:  Diagnosis Date   GERD (gastroesophageal reflux disease)    Hypertension     There are no problems to display for this patient.   History reviewed. No pertinent surgical history.     No family history on file.  Social History   Tobacco Use   Smoking status: Every Day    Packs/day: 0.50    Types: Cigarettes   Smokeless tobacco: Never  Vaping Use   Vaping Use: Never used  Substance Use Topics   Alcohol use: Yes    Comment: couple shots of liquor every couple of weeks    Drug use: No    Home Medications Prior to Admission medications   Medication Sig Start Date End Date Taking? Authorizing Provider  amoxicillin (AMOXIL) 500 MG capsule Take 1,500 mg by mouth 2 (two) times daily. 12/26/20  Yes [provider]  ondansetron (ZOFRAN ODT) 4 MG disintegrating tablet Take 1 tablet (4 mg total) by mouth every 8 (eight) hours as needed for nausea or vomiting. 12/28/20  Yes Emre Stock, Raynelle Fanning, PA-C  hydrochlorothiazide (HYDRODIURIL) 25 MG  tablet Take 1 tablet (25 mg total) by mouth daily. 07/14/19 08/13/19  Pollyann Savoy, MD  ibuprofen (ADVIL) 800 MG tablet Take 1 tablet (800 mg total) by mouth every 8 (eight) hours as needed for headache or moderate pain. Patient not taking: Reported on 12/28/2020 07/17/19   Long, Arlyss Repress, MD  levofloxacin (LEVAQUIN) 500 MG tablet Take 500 mg by mouth daily. Patient not taking: Reported on 12/28/2020 12/27/20   [provider]  meclizine (ANTIVERT) 25 MG tablet Take 25 mg by mouth 3 (three) times daily as needed. Patient not taking: Reported on 12/28/2020 12/26/20   [provider]  prochlorperazine (COMPAZINE) 10 MG tablet Take 1 tablet (10 mg total) by mouth 2 (two) times daily as needed for nausea or vomiting (headache). Patient not taking: Reported on 12/28/2020 07/17/19   Long, Arlyss Repress, MD    Allergies    Banana  Review of Systems   Review of Systems  Constitutional:  Positive for appetite change, chills and fever.  HENT:  Negative for congestion, ear pain, rhinorrhea, sinus pressure, sore throat, trouble swallowing and voice change.   Eyes:  Negative for discharge.  Respiratory:  Positive for cough. Negative for shortness of breath, wheezing and stridor.   Cardiovascular:  Negative for chest pain.  Gastrointestinal:  Positive for nausea. Negative for abdominal distention, abdominal pain and vomiting.  Genitourinary:  Positive for decreased urine volume.  Musculoskeletal:  Positive for myalgias.   Physical Exam Updated Vital Signs BP 117/82   Pulse 79   Temp 98.9 F (37.2 C)   Resp 18   SpO2 98%   Physical Exam Vitals and nursing note reviewed.  Constitutional:      Appearance: He is well-developed.  HENT:     Head: Normocephalic and atraumatic.     Mouth/Throat:     Pharynx: Oropharynx is clear. No pharyngeal swelling or oropharyngeal exudate.     Comments: Mucosa dry Eyes:     Conjunctiva/sclera: Conjunctivae normal.  Cardiovascular:     Rate and  Rhythm: Normal rate and regular rhythm.     Heart sounds: Normal heart sounds.  Pulmonary:     Effort: Pulmonary effort is normal.     Breath sounds: Normal breath sounds. No wheezing.  Abdominal:     General: Bowel sounds are normal. There is no distension.     Palpations: Abdomen is soft.     Tenderness: There is no abdominal tenderness. There is no guarding or rebound.  Musculoskeletal:        General: Normal range of motion.     Cervical back: Normal range of motion.  Skin:    General: Skin is warm and dry.  Neurological:     Mental Status: He is alert.    ED Results / Procedures / Treatments   Labs (all labs ordered are listed, but only abnormal results are displayed) Labs Reviewed  CBC WITH DIFFERENTIAL/PLATELET - Abnormal; Notable for the following components:      Result Value   RBC 5.84 (*)    Monocytes Absolute 1.3 (*)    All other components within normal limits  COMPREHENSIVE METABOLIC PANEL - Abnormal; Notable for the following components:   Sodium 128 (*)    Chloride 95 (*)    Glucose, Bld 106 (*)    Calcium 8.5 (*)    AST 54 (*)    ALT 52 (*)    Total Bilirubin 1.5 (*)    All other components within normal limits  URINALYSIS, ROUTINE W REFLEX MICROSCOPIC - Abnormal; Notable for the following components:   APPearance HAZY (*)    Hgb urine dipstick MODERATE (*)    Ketones, ur 5 (*)    Protein, ur 100 (*)    Bacteria, UA RARE (*)    All other components within normal limits  RESP PANEL BY RT-PCR (FLU A&B, COVID) ARPGX2    EKG None  Radiology No results found.  Procedures Procedures   Medications Ordered in ED Medications  ondansetron (ZOFRAN) injection 4 mg (4 mg Intravenous Given 12/28/20 1356)  sodium chloride 0.9 % bolus 1,000 mL (0 mLs Intravenous Stopped 12/28/20 1456)  acetaminophen (TYLENOL) tablet 650 mg (650 mg Oral Given 12/28/20 1356)  sodium chloride 0.9 % bolus 1,000 mL (1,000 mLs Intravenous New Bag/Given 12/28/20 1559)    ED  Course  I have reviewed the triage vital signs and the nursing notes.  Pertinent labs & imaging results that were available during my care of the patient were reviewed by me and considered in my medical decision making (see chart for details).    MDM Rules/Calculators/A&P                           Patient's labs reviewed and discussed with patient.  I suspect he does have a viral flulike illness, we have been seeing many  patients with similar symptoms who have tested flu negative for the past week.  He was given home instructions, Zofran to help with the nausea.  He was able to tolerate p.o. fluids here and his headache was resolved, fever was resolved and he felt well at time of discharge.  Return precautions were outlined. Final Clinical Impression(s) / ED Diagnoses Final diagnoses:  Flu-like symptoms    Rx / DC Orders ED Discharge Orders          Ordered    ondansetron (ZOFRAN ODT) 4 MG disintegrating tablet  Every 8 hours PRN        12/28/20 1740             Burgess Amor, PA-C 12/28/20 1756    Vanetta Mulders, MD 12/29/20 [phone]

## 2020-12-28 NOTE — ED Notes (Signed)
Pt ambulatory to restroom to give urine sample Pt denies increased N/V or abdominal pain with water and snacks

## 2021-03-12 IMAGING — CT CT HEAD W/O CM
4 series · 17 of 47 positions shown, 19 images · non-contrast
Comparison: None

CLINICAL DATA: Retro-orbital headache over the last 3 days.
Dizziness.

EXAM:
CT HEAD WITHOUT CONTRAST
TECHNIQUE: Contiguous axial images were obtained from the base of the skull
through the vertex without intravenous contrast.

[Series 2: head w o · axial · 0.41mm/px · z∈[+1590,+1705]mm · 7 of 31 slices shown, 9 images]
[im 4/31  brain]
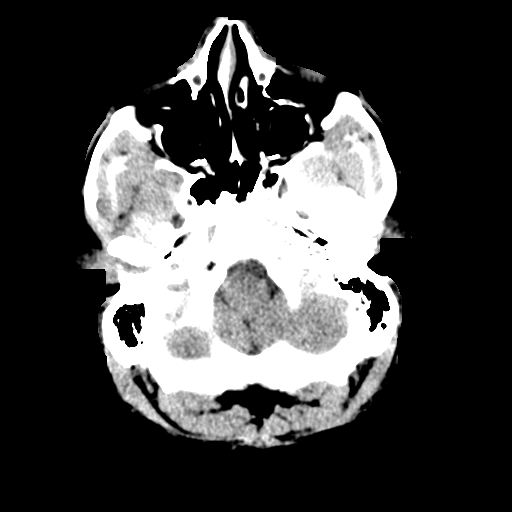
[im 4/31  bone]
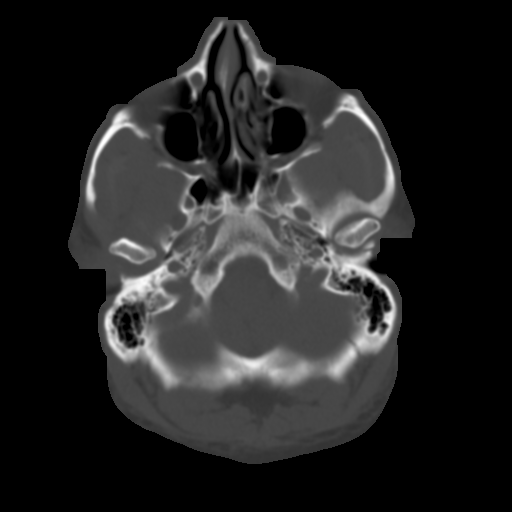
[im 8/31  brain]
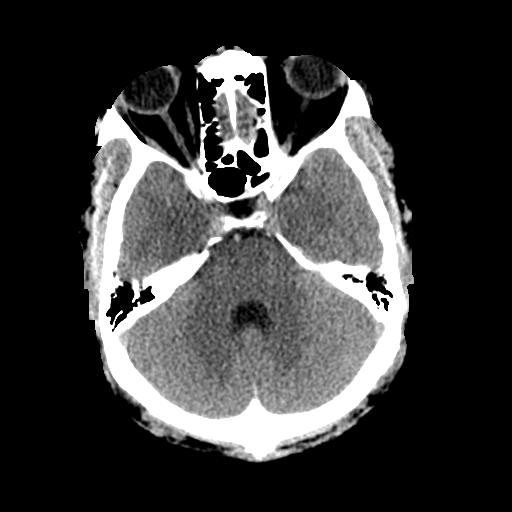
[im 12/31  brain]
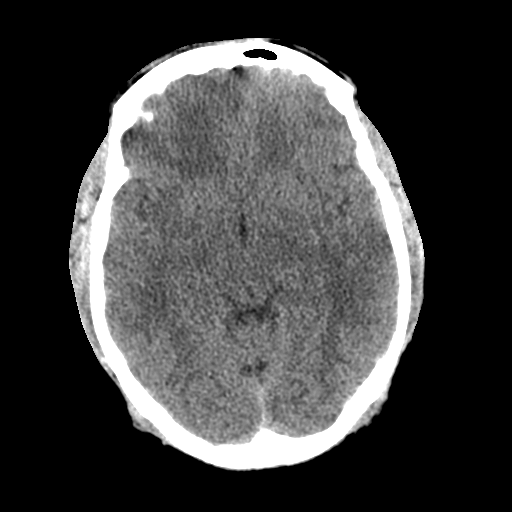
[im 16/31  brain]
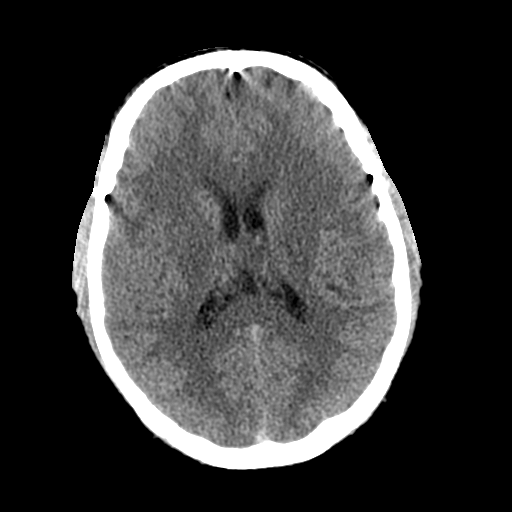
[im 19/31  brain]
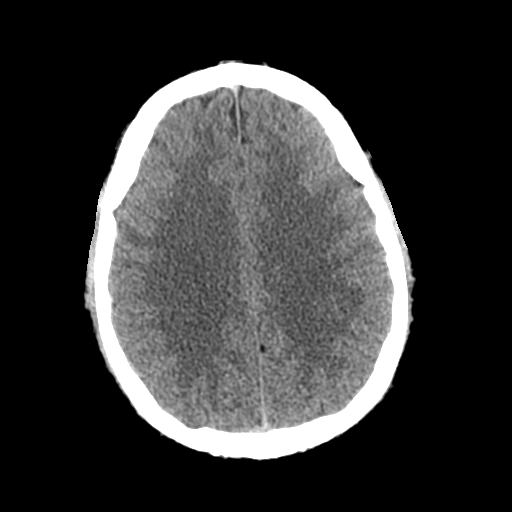
[im 19/31  bone]
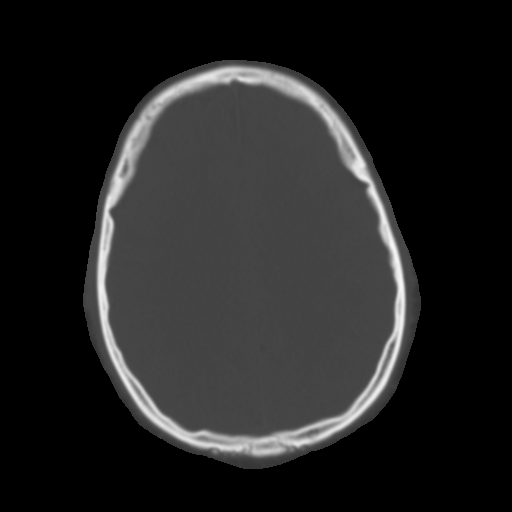
[im 23/31  brain]
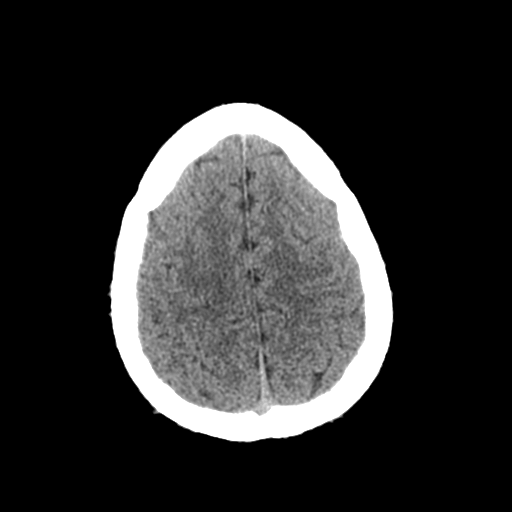
[im 27/31  brain]
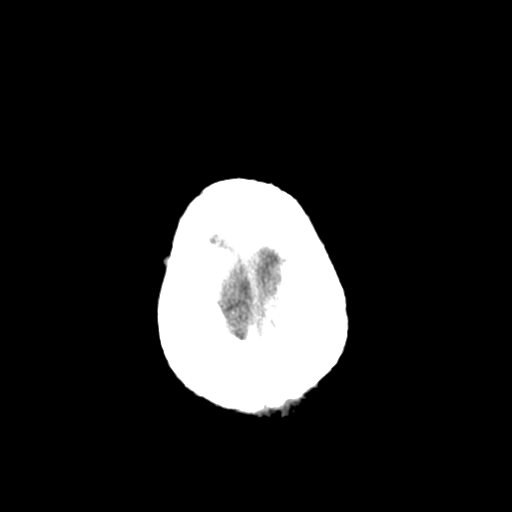

[Series 3: head bone · axial · 0.41mm/px · z∈[+1589,+1643]mm · 4 of 77 slices shown]
[im 8/77  bone]
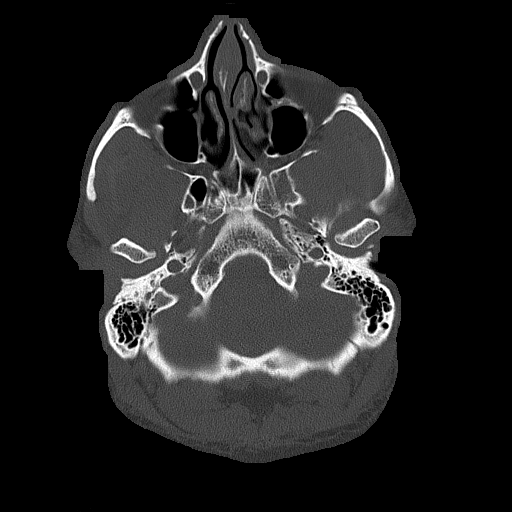
[im 16/77  bone]
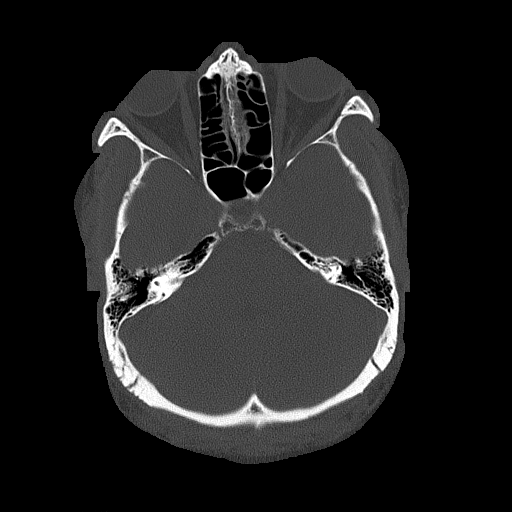
[im 23/77  bone]
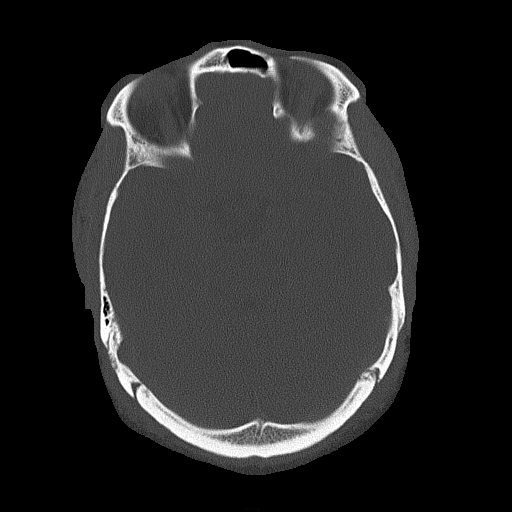
[im 35/77  bone]
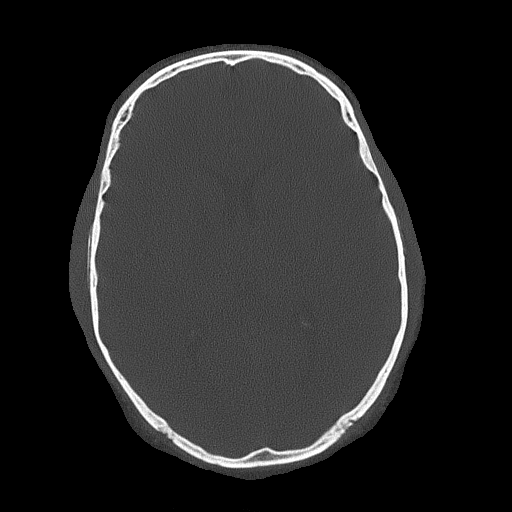

[Series 4: coronal soft · coronal · 0.32mm/px · 3 of 80 slices shown]
[im 27/80  brain]
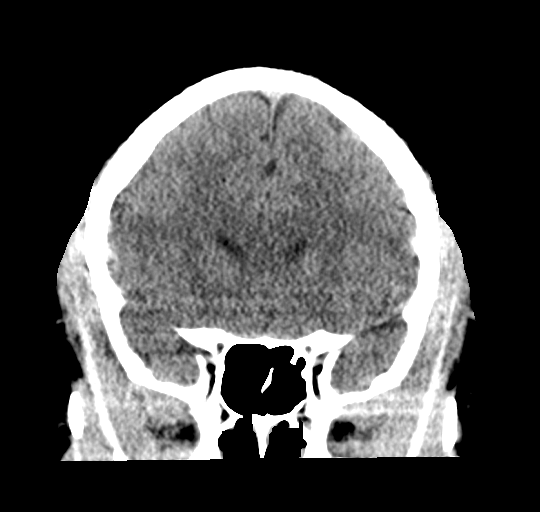
[im 36/80  brain]
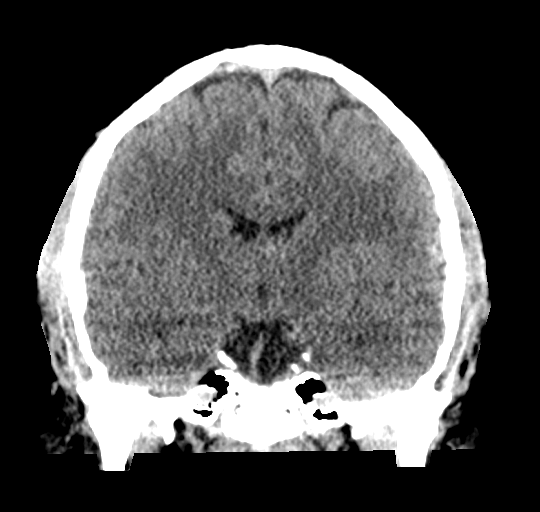
[im 44/80  brain]
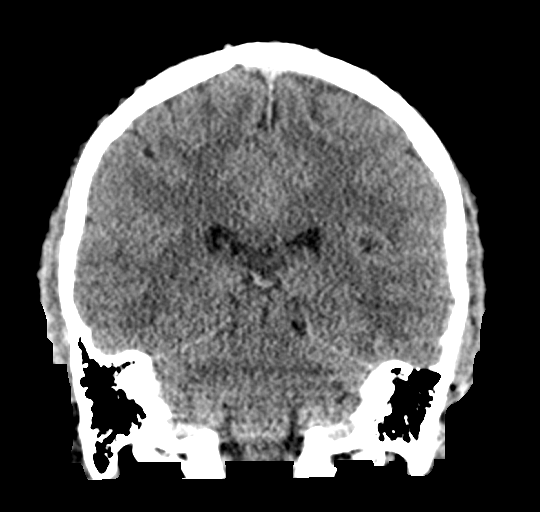

[Series 5: sagittal soft · sagittal · 0.34mm/px · 3 of 58 slices shown]
[im 20/58  brain]
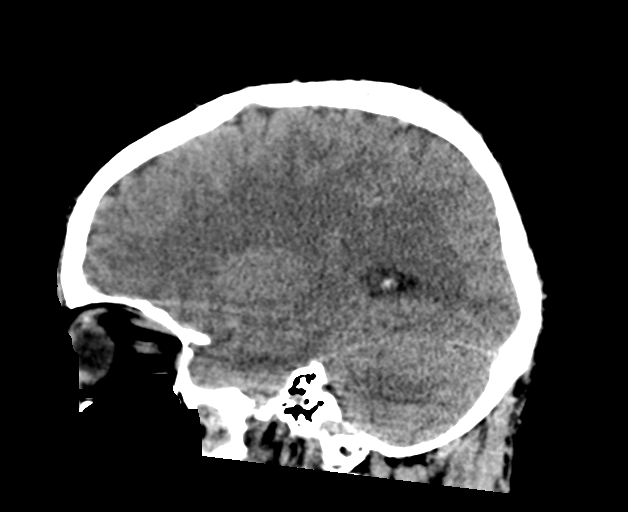
[im 29/58  brain]
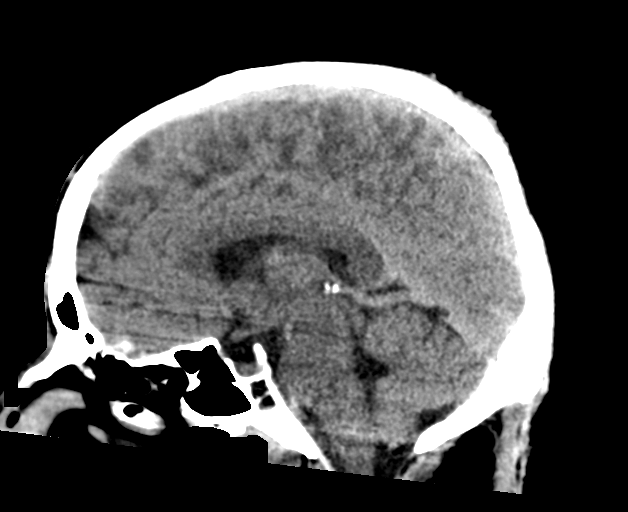
[im 39/58  brain]
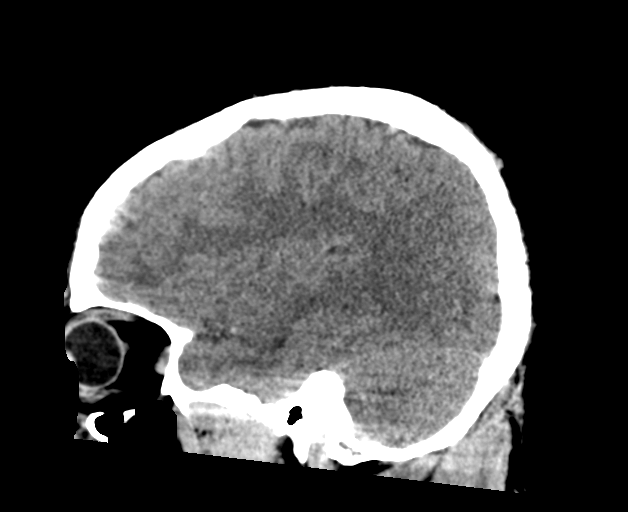

[17 of 47 positions shown; findings below may reference images not displayed]

FINDINGS: Brain: The brain shows a normal appearance without evidence of
malformation, atrophy, old or acute small or large vessel
infarction, mass lesion, hemorrhage, hydrocephalus or extra-axial
collection.

Vascular: No hyperdense vessel. No evidence of atherosclerotic
calcification.

Skull: Normal.  No traumatic finding.  No focal bone lesion.

Sinuses/Orbits: Sinuses are clear. Orbits appear normal. Mastoids
are clear.

Other: None significant
IMPRESSION: Normal head CT

## 2023-02-01 ENCOUNTER — Other Ambulatory Visit: Payer: Self-pay

## 2023-02-01 ENCOUNTER — Encounter (HOSPITAL_COMMUNITY): Payer: Self-pay

## 2023-02-01 ENCOUNTER — Emergency Department (HOSPITAL_COMMUNITY)
Admission: EM | Admit: 2023-02-01 | Discharge: 2023-02-01 | Disposition: A | Discharge status: Home / Self Care | Payer: BLUE CROSS/BLUE SHIELD | Attending: Emergency Medicine | Admitting: Emergency Medicine

## 2023-02-01 DIAGNOSIS — I10 Essential (primary) hypertension: Secondary | ICD-10-CM | POA: Insufficient documentation

## 2023-02-01 DIAGNOSIS — Z79899 Other long term (current) drug therapy: Secondary | ICD-10-CM | POA: Insufficient documentation

## 2023-02-01 DIAGNOSIS — K0889 Other specified disorders of teeth and supporting structures: Secondary | ICD-10-CM | POA: Diagnosis present

## 2023-02-01 DIAGNOSIS — K029 Dental caries, unspecified: Secondary | ICD-10-CM | POA: Diagnosis not present

## 2023-02-01 MED ORDER — AMLODIPINE BESYLATE 5 MG PO TABS: 5.0000 mg | ORAL_TABLET | Freq: Every day | ORAL | 0 refills | Status: AC

## 2023-02-01 MED ORDER — HYDROMORPHONE HCL 1 MG/ML IJ SOLN
0.5000 mg | Freq: Once | INTRAMUSCULAR | Status: AC
  Administered 2023-02-01: 0.5 mg via INTRAMUSCULAR
  Filled 2023-02-01: qty 0.5

## 2023-02-01 MED ORDER — OXYCODONE-ACETAMINOPHEN 5-325 MG PO TABS
1.0000 | ORAL_TABLET | Freq: Once | ORAL | Status: AC
  Administered 2023-02-01: 1 via ORAL
  Filled 2023-02-01: qty 1

## 2023-02-01 MED ORDER — CLINDAMYCIN HCL 300 MG PO CAPS: 300.0000 mg | ORAL_CAPSULE | Freq: Four times a day (QID) | ORAL | 0 refills | Status: AC

## 2023-02-01 MED ORDER — AMLODIPINE BESYLATE 5 MG PO TABS
5.0000 mg | ORAL_TABLET | Freq: Once | ORAL | Status: AC
  Administered 2023-02-01: 5 mg via ORAL
  Filled 2023-02-01: qty 1

## 2023-02-01 MED ORDER — OXYCODONE-ACETAMINOPHEN 5-325 MG PO TABS: 1.0000 | ORAL_TABLET | ORAL | 0 refills | Status: AC | PRN

## 2023-02-01 NOTE — ED Provider Notes (Signed)
[facility] AT [facility] Provider Note   CSN: [id number] Arrival date & time: 02/01/23  1512     History  Chief Complaint  Patient presents with   Dental Pain    Anthony Shea is a 47 y.o. male.   Dental Pain Associated symptoms: facial swelling   Associated symptoms: no congestion, no fever and no headaches         Anthony Shea is a 47 y.o. male with past medical history of GERD and hypertension who presents to the Emergency Department complaining of dental pain, right-sided facial swelling and hypertension.  He was seen earlier at urgent care and was sent here for evaluation due to his elevated blood pressure.  States he was diagnosed with high blood pressure sometime ago, he was prescribed hydrochlorothiazide but states that he stopped taking the medication because he did not like the way it made him feel.  He developed pain of his face and tooth several days ago.  He has had gradually worsening swelling to his right face.  He describes pain radiating toward his cheek.  Pain is worse with chewing.  He has had a dental caries of the right upper tooth for some time, but states he was waiting till after first of the year for his insurance to cover getting the tooth fixed.  He denies any difficulty swallowing pain of his neck, fever, or difficulty opening closing his mouth.  No chest pain, shortness of breath, dizziness, headache or visual changes.     Home Medications Prior to Admission medications   Medication Sig Start Date End Date Taking? Authorizing Provider  amoxicillin (AMOXIL) 500 MG capsule Take 1,500 mg by mouth 2 (two) times daily. 12/26/20   [provider]  hydrochlorothiazide (HYDRODIURIL) 25 MG tablet Take 1 tablet (25 mg total) by mouth daily. 07/14/19 08/13/19  Pollyann Savoy, MD  ibuprofen (ADVIL) 800 MG tablet Take 1 tablet (800 mg total) by mouth every 8 (eight) hours as needed for headache or moderate  pain. Patient not taking: Reported on 12/28/2020 07/17/19   Long, Arlyss Repress, MD  levofloxacin (LEVAQUIN) 500 MG tablet Take 500 mg by mouth daily. Patient not taking: Reported on 12/28/2020 12/27/20   [provider]  meclizine (ANTIVERT) 25 MG tablet Take 25 mg by mouth 3 (three) times daily as needed. Patient not taking: Reported on 12/28/2020 12/26/20   [provider]  ondansetron (ZOFRAN ODT) 4 MG disintegrating tablet Take 1 tablet (4 mg total) by mouth every 8 (eight) hours as needed for nausea or vomiting. 12/28/20   Burgess Amor, PA-C  prochlorperazine (COMPAZINE) 10 MG tablet Take 1 tablet (10 mg total) by mouth 2 (two) times daily as needed for nausea or vomiting (headache). Patient not taking: Reported on 12/28/2020 07/17/19   Long, Arlyss Repress, MD      Allergies    Banana    Review of Systems   Review of Systems  Constitutional:  Negative for appetite change, chills and fever.  HENT:  Positive for dental problem and facial swelling. Negative for congestion, sore throat and trouble swallowing.   Eyes:  Negative for visual disturbance.  Respiratory:  Negative for cough and shortness of breath.   Cardiovascular:  Negative for chest pain.  Gastrointestinal:  Negative for abdominal pain, diarrhea, nausea and vomiting.  Skin:  Negative for wound.  Neurological:  Negative for dizziness, syncope, weakness, numbness and headaches.    Physical Exam Updated Vital Signs BP Marland Kitchen)  179/114 (BP Location: Right Arm)   Pulse 75   Temp 98.3 F (36.8 C) (Oral)   Resp 16   Ht 5\' 4"  (1.626 m)   Wt 84.8 kg   SpO2 97%   BMI 32.10 kg/m  Physical Exam Vitals and nursing note reviewed.  Constitutional:      General: He is not in acute distress.    Appearance: Normal appearance. He is not ill-appearing or toxic-appearing.  HENT:     Right Ear: Tympanic membrane and ear canal normal.     Left Ear: Tympanic membrane and ear canal normal.     Mouth/Throat:     Mouth: Mucous  membranes are moist.     Dentition: Dental tenderness and dental caries present.     Pharynx: Oropharynx is clear. Uvula midline. No oropharyngeal exudate or posterior oropharyngeal erythema.     Comments: Ttp of the right upper first premolar.  Mild erythema of the surrounding gums.  Mild edema of the right face.  No sublingual abnormalities, no trismus, uvula is midline nonedematous. Cardiovascular:     Rate and Rhythm: Normal rate and regular rhythm.     Pulses: Normal pulses.  Pulmonary:     Effort: Pulmonary effort is normal. No respiratory distress.  Chest:     Chest wall: No tenderness.  Abdominal:     Palpations: Abdomen is soft.     Tenderness: There is no abdominal tenderness.  Musculoskeletal:        General: Normal range of motion.     Cervical back: Normal range of motion. No rigidity or tenderness.  Lymphadenopathy:     Cervical: No cervical adenopathy.  Skin:    General: Skin is warm.     Capillary Refill: Capillary refill takes less than 2 seconds.     Findings: No rash.  Neurological:     General: No focal deficit present.     Mental Status: He is alert.     Sensory: No sensory deficit.     Motor: No weakness.     ED Results / Procedures / Treatments   Labs (all labs ordered are listed, but only abnormal results are displayed) Labs Reviewed - No data to display  EKG None  Radiology No results found.  Procedures Procedures    Medications Ordered in ED Medications  oxyCODONE-acetaminophen (PERCOCET/ROXICET) 5-325 MG per tablet 1 tablet (1 tablet Oral Given 02/01/23 1615)  amLODipine (NORVASC) tablet 5 mg (5 mg Oral Given 02/01/23 1615)    ED Course/ Medical Decision Making/ A&P                                 Medical Decision Making Patient here for evaluation of pain swelling of his right face along with right upper dental pain.  He notes dental caries of a right upper tooth for some time has been trying to wait till after first of the year  when his insurance will pay for having the tooth fixed.  Developed pain and swelling over the last several days.  He is also noticed that his blood pressure has been elevated.  He was not taking hydrochlorothiazide, but did not like the way this made him feel so he stopped.  He denies any associated chest pain, shortness of breath, visual changes, headache or dizziness.  No fever or chills difficulty swallowing or opening closing his mouth.  I suspect facial swelling secondary to periapical abscess.  No drainable  abscess on exam.  No reported neurologic symptoms to suggest need for imaging or labs at this time.  Patient with documented history of hypertension noncompliant with medication due to medication intolerance.  Will treat here for pain and will try dose of calcium channel blocker and reassess.  Amount and/or Complexity of Data Reviewed Discussion of management or test interpretation with external provider(s): On recheck, patient reports only minimal pain relief after Percocet.  Given IM injection of pain medication.  On recheck of patient's blood pressure, pressure has improved.  I feel that he is appropriate for discharge home, will treat with a short course of pain medication, database reviewed, antibiotics and prescription for amlodipine.  Will have him discontinue the hydrochlorothiazide.  He was given follow-up information for primary care as well.  Risk Prescription drug management.           Final Clinical Impression(s) / ED Diagnoses Final diagnoses:  Pain, dental  Hypertension, unspecified type    Rx / DC Orders ED Discharge Orders     None         Rosey Bath 02/01/23 1826    Benjiman Core, MD 02/01/23 2344

## 2023-02-01 NOTE — ED Triage Notes (Signed)
RIGHT upper dental pain Went to urgent care Sent to ED for HTN

## 2023-02-01 NOTE — Discharge Instructions (Signed)
Your blood pressure today was elevated.  It is important that you take your blood pressure medication as directed every day.  I have listed 2 of the local primary care clinics that you may contact one of them to establish primary care.  Also, I have listed several other resources in the area for dentistry.  Please contact one of them to arrange follow-up appointment regarding your tooth.  Take the antibiotic as directed until it is finished.
